# Patient Record
Sex: Male | Born: 1965 | Race: White | Hispanic: No | Marital: Married | Smoking: Former smoker
Health system: Southern US, Community
[De-identification: ages and names within clinical notes are randomized; demographics above are authoritative.]

## PROBLEM LIST (undated history)

## (undated) DIAGNOSIS — J111 Influenza due to unidentified influenza virus with other respiratory manifestations: Secondary | ICD-10-CM

## (undated) DIAGNOSIS — M2550 Pain in unspecified joint: Secondary | ICD-10-CM

## (undated) DIAGNOSIS — K219 Gastro-esophageal reflux disease without esophagitis: Secondary | ICD-10-CM

## (undated) DIAGNOSIS — M1990 Unspecified osteoarthritis, unspecified site: Secondary | ICD-10-CM

## (undated) DIAGNOSIS — M9110 Juvenile osteochondrosis of head of femur [Legg-Calve-Perthes], unspecified leg: Secondary | ICD-10-CM

## (undated) DIAGNOSIS — Z8709 Personal history of other diseases of the respiratory system: Secondary | ICD-10-CM

---

## 2013-07-02 DIAGNOSIS — J111 Influenza due to unidentified influenza virus with other respiratory manifestations: Secondary | ICD-10-CM

## 2013-07-02 HISTORY — DX: Influenza due to unidentified influenza virus with other respiratory manifestations: J11.1

## 2013-12-26 ENCOUNTER — Other Ambulatory Visit: Payer: Self-pay | Admitting: Orthopedic Surgery

## 2014-01-09 ENCOUNTER — Encounter (HOSPITAL_COMMUNITY): Payer: Self-pay | Admitting: Pharmacy Technician

## 2014-01-10 NOTE — Pre-Procedure Instructions (Signed)
Phillip BarrenShannon Floyd  01/10/2014   Your procedure is scheduled on:  Wed, May 20 @ 12:50 PM  [facility]  at 10:45 AM.  Call this number if you have problems the morning of surgery: [phone]   Remember:   Do not eat food or drink liquids after midnight.   Take these medicines the morning of surgery with A SIP OF WATER: Pain Pill(if needed) and Omeprazole(Prilosec)                  No Goody's,BC's,Aleve,Aspirin,Ibuprofen,Fish Oil,or any Herbal Medications   Do not wear jewelry, make-up or nail polish.  Do not wear lotions, powders, or perfumes. You may wear deodorant.  Do not shave 48 hours prior to surgery.   Do not bring valuables to the hospital.  [facility] is not responsible                  for any belongings or valuables.               Contacts, dentures or bridgework may not be worn into surgery.  Leave suitcase in the car. After surgery it may be brought to your room.  For patients admitted to the hospital, discharge time is determined by your                treatment team.                 Special Instructions:  [name] - Preparing for Surgery  Before surgery, you can play an important role.  Because skin is not sterile, your skin needs to be as free of germs as possible.  You can reduce the number of germs on you skin by washing with CHG (chlorahexidine gluconate) soap before surgery.  CHG is an antiseptic cleaner which kills germs and bonds with the skin to continue killing germs even after washing.  Please DO NOT use if you have an allergy to CHG or antibacterial soaps.  If your skin becomes reddened/irritated stop using the CHG and inform your nurse when you arrive at Short Stay.  Do not shave (including legs and underarms) for at least 48 hours prior to the first CHG shower.  You may shave your face.  Please follow these instructions carefully:   1.  Shower with CHG Soap the night before surgery and the                                morning of  Surgery.  2.  If you choose to wash your hair, wash your hair first as usual with your       normal shampoo.  3.  After you shampoo, rinse your hair and body thoroughly to remove the                      Shampoo.  4.  Use CHG as you would any other liquid soap.  You can apply chg directly       to the skin and wash gently with scrungie or a clean washcloth.  5.  Apply the CHG Soap to your body ONLY FROM THE NECK DOWN.        Do not use on open wounds or open sores.  Avoid contact with your eyes,       ears, mouth and genitals (private parts).  Wash genitals (private parts)  with your normal soap.  6.  Wash thoroughly, paying special attention to the area where your surgery        will be performed.  7.  Thoroughly rinse your body with warm water from the neck down.  8.  DO NOT shower/wash with your normal soap after using and rinsing off       the CHG Soap.  9.  Pat yourself dry with a clean towel.            10.  Wear clean pajamas.            11.  Place clean sheets on your bed the night of your first shower and do not        sleep with pets.  Day of Surgery  Do not apply any lotions/deoderants the morning of surgery.  Please wear clean clothes to the hospital/surgery center.     Please read over the following fact sheets that you were given: Pain Booklet, Coughing and Deep Breathing, Blood Transfusion Information, MRSA Information and Surgical Site Infection Prevention

## 2014-01-11 ENCOUNTER — Encounter (HOSPITAL_COMMUNITY)
Admission: RE | Admit: 2014-01-11 | Discharge: 2014-01-11 | Disposition: A | Discharge status: Home / Self Care | Payer: BC Managed Care – PPO | Source: Ambulatory Visit | Attending: Orthopedic Surgery | Admitting: Orthopedic Surgery

## 2014-01-11 ENCOUNTER — Encounter (HOSPITAL_COMMUNITY): Payer: Self-pay

## 2014-01-11 DIAGNOSIS — Z01812 Encounter for preprocedural laboratory examination: Secondary | ICD-10-CM | POA: Insufficient documentation

## 2014-01-11 DIAGNOSIS — Z01810 Encounter for preprocedural cardiovascular examination: Secondary | ICD-10-CM | POA: Insufficient documentation

## 2014-01-11 DIAGNOSIS — Z01818 Encounter for other preprocedural examination: Secondary | ICD-10-CM | POA: Insufficient documentation

## 2014-01-11 HISTORY — DX: Unspecified osteoarthritis, unspecified site: M19.90

## 2014-01-11 HISTORY — DX: Pain in unspecified joint: M25.50

## 2014-01-11 HISTORY — DX: Influenza due to unidentified influenza virus with other respiratory manifestations: J11.1

## 2014-01-11 HISTORY — DX: Gastro-esophageal reflux disease without esophagitis: K21.9

## 2014-01-11 HISTORY — DX: Personal history of other diseases of the respiratory system: Z87.09

## 2014-01-11 LAB — TYPE AND SCREEN
ABO/RH(D): A POS
Antibody Screen: NEGATIVE

## 2014-01-11 LAB — URINALYSIS, ROUTINE W REFLEX MICROSCOPIC
Bilirubin Urine: NEGATIVE
GLUCOSE, UA: NEGATIVE mg/dL
Hgb urine dipstick: NEGATIVE
Ketones, ur: NEGATIVE mg/dL
LEUKOCYTES UA: NEGATIVE
NITRITE: NEGATIVE
PH: 5 (ref 5.0–8.0)
PROTEIN: NEGATIVE mg/dL
Specific Gravity, Urine: 1.026 (ref 1.005–1.030)
Urobilinogen, UA: 0.2 mg/dL (ref 0.0–1.0)

## 2014-01-11 LAB — SURGICAL PCR SCREEN
MRSA, PCR: NEGATIVE
Staphylococcus aureus: NEGATIVE

## 2014-01-11 LAB — CBC WITH DIFFERENTIAL/PLATELET
Basophils Absolute: 0 10*3/uL (ref 0.0–0.1)
Basophils Relative: 1 % (ref 0–1)
EOS ABS: 0.1 10*3/uL (ref 0.0–0.7)
Eosinophils Relative: 2 % (ref 0–5)
HEMATOCRIT: 41.8 % (ref 39.0–52.0)
HEMOGLOBIN: 14.3 g/dL (ref 13.0–17.0)
Lymphocytes Relative: 25 % (ref 12–46)
Lymphs Abs: 1.7 10*3/uL (ref 0.7–4.0)
MCH: 30.4 pg (ref 26.0–34.0)
MCHC: 34.2 g/dL (ref 30.0–36.0)
MCV: 88.7 fL (ref 78.0–100.0)
MONO ABS: 0.7 10*3/uL (ref 0.1–1.0)
Monocytes Relative: 10 % (ref 3–12)
Neutro Abs: 4.2 10*3/uL (ref 1.7–7.7)
Neutrophils Relative %: 62 % (ref 43–77)
Platelets: 308 10*3/uL (ref 150–400)
RBC: 4.71 MIL/uL (ref 4.22–5.81)
RDW: 12.9 % (ref 11.5–15.5)
WBC: 6.7 10*3/uL (ref 4.0–10.5)

## 2014-01-11 LAB — BASIC METABOLIC PANEL
BUN: 14 mg/dL (ref 6–23)
CHLORIDE: 104 meq/L (ref 96–112)
CO2: 25 meq/L (ref 19–32)
Calcium: 9.9 mg/dL (ref 8.4–10.5)
Creatinine, Ser: 0.86 mg/dL (ref 0.50–1.35)
GFR calc Af Amer: >90 mL/min (ref >90)
GFR calc non Af Amer: >90 mL/min (ref >90)
Glucose, Bld: 83 mg/dL (ref 70–99)
POTASSIUM: 3.9 meq/L (ref 3.7–5.3)
Sodium: 140 mEq/L (ref 137–147)

## 2014-01-11 LAB — ABO/RH: ABO/RH(D): A POS

## 2014-01-11 LAB — PROTIME-INR
INR: 0.96 (ref 0.00–1.49)
Prothrombin Time: 12.6 seconds (ref 11.6–15.2)

## 2014-01-11 LAB — APTT: aPTT: 26 seconds (ref 24–37)

## 2014-01-11 MED ORDER — CHLORHEXIDINE GLUCONATE 4 % EX LIQD: 60.0000 mL | Freq: Once | CUTANEOUS | Status: DC

## 2014-01-11 NOTE — Progress Notes (Signed)
Pt doesn't have a cardiologist  Pt doesn't have a medical md  Denies ever having an echo/heart cath/stress test  Denies EKG or CXR in past y

## 2014-01-17 MED ORDER — DEXTROSE-NACL 5-0.45 % IV SOLN: INTRAVENOUS | Status: DC

## 2014-01-17 MED ORDER — CEFAZOLIN SODIUM-DEXTROSE 2-3 GM-% IV SOLR
2.0000 g | INTRAVENOUS | Status: AC
  Administered 2014-01-18: 2 g via INTRAVENOUS
  Filled 2014-01-17: qty 50

## 2014-01-17 NOTE — H&P (Signed)
TOTAL HIP ADMISSION H&P  Patient is admitted for left total hip arthroplasty.  Subjective:  Chief Complaint: left hip pain  HPI: Phillip Floyd, 48 y.o. male, has a history of pain and functional disability in the left hip(s) due to arthritis from prior history of legg calve perthes and patient has failed non-surgical conservative treatments for greater than 12 weeks to include NSAID's and/or analgesics, use of assistive devices and activity modification.  Onset of symptoms was gradual starting >10 years ago with gradually worsening course since that time.The patient noted no past surgery on the left hip(s).  Patient currently rates pain in the left hip at 10 out of 10 with activity. Patient has night pain, worsening of pain with activity and weight bearing, pain that interfers with activities of daily living, pain with passive range of motion and crepitus. Patient has evidence of joint space narrowing and flattening of the femoral head by imaging studies. This condition presents safety issues increasing the risk of falls. This patient has had leg calve Perthes.  There is no current active infection.  There are no active problems to display for this patient.  Past Medical History  Diagnosis Date  . GERD (gastroesophageal reflux disease)     takes Omeprazole daily  . History of bronchitis end of 2014  . Flu 07/2013  . Arthritis   . Joint pain     No past surgical history on file.  No prescriptions prior to admission   Allergies  Allergen Reactions  . Tramadol     headaches    History  Substance Use Topics  . Smoking status: Former Games developermoker  . Smokeless tobacco: Not on file     Comment: quit smoking 20+yrs ago  . Alcohol Use: No    No family history on file.   Review of Systems  Constitutional: Negative.   HENT: Negative.   Eyes:       Glasses  Respiratory: Negative.   Cardiovascular: Negative.   Gastrointestinal: Negative.   Genitourinary: Negative.   Musculoskeletal:  Positive for joint pain.  Skin: Negative.   Neurological: Negative.   Endo/Heme/Allergies: Negative.   Psychiatric/Behavioral: Negative.     Objective:  Physical Exam  Constitutional: He is oriented to person, place, and time. He appears well-developed and well-nourished.  HENT:  Head: Normocephalic and atraumatic.  Eyes: Pupils are equal, round, and reactive to light.  Neck: Normal range of motion. Neck supple.  Cardiovascular: Intact distal pulses.   Respiratory: Effort normal.  Musculoskeletal: He exhibits tenderness.  Patient's right hip has good strength and good range of motion.  Patient's left hip does have obvious reduced range of motion more so with internal rotation and external rotation.  Patient has approximately 5-10 of internal rotation.  He has pain with palpation of the groin and mild lateral tenderness over the greater trochanteric bursal region.  Increased pain with hip flexion extension internal/external rotation and log roll.  He has brisk capillary refill and is neurovascularly intact distally.  His calves are soft and nontender.  Neurological: He is alert and oriented to person, place, and time.  Skin: Skin is warm and dry.  Psychiatric: He has a normal mood and affect. His behavior is normal. Judgment and thought content normal.    Vital signs in last 24 hours:    Labs:   There is no height or weight on file to calculate BMI.   Imaging Review X-rays: AP of the pelvis and one view of the left hip are reviewed  in office today.  Dr. Turner Danielsowan has also reviewed these x-rays.  Patient does appear to have leg Perthes disease of the left hip with flattening of the femoral head and sclerosis of the acetabulum.  Patient's left hip does appear to be chronically subluxed.  Assessment/Plan:  End stage arthritis, left hip(s)  The patient history, physical examination, clinical judgement of the provider and imaging studies are consistent with end stage degenerative joint  disease of the left hip(s) and total hip arthroplasty is deemed medically necessary. The treatment options including medical management, injection therapy, arthroscopy and arthroplasty were discussed at length. The risks and benefits of total hip arthroplasty were presented and reviewed. The risks due to aseptic loosening, infection, stiffness, dislocation/subluxation,  thromboembolic complications and other imponderables were discussed.  The patient acknowledged the explanation, agreed to proceed with the plan and consent was signed. Patient is being admitted for inpatient treatment for surgery, pain control, PT, OT, prophylactic antibiotics, VTE prophylaxis, progressive ambulation and ADL's and discharge planning.The patient is planning to be discharged home with home health services

## 2014-01-18 ENCOUNTER — Inpatient Hospital Stay (HOSPITAL_COMMUNITY)
Admission: RE | Admit: 2014-01-18 | Discharge: 2014-01-20 | DRG: 470 | Disposition: A | Discharge status: Home Health | Payer: BC Managed Care – PPO | Source: Ambulatory Visit | Attending: Orthopedic Surgery | Admitting: Orthopedic Surgery

## 2014-01-18 ENCOUNTER — Encounter (HOSPITAL_COMMUNITY)
Admission: RE | Disposition: A | Discharge status: Home Health | Payer: Self-pay | Source: Ambulatory Visit | Attending: Orthopedic Surgery

## 2014-01-18 ENCOUNTER — Inpatient Hospital Stay (HOSPITAL_COMMUNITY): Payer: BC Managed Care – PPO | Admitting: Certified Registered"

## 2014-01-18 ENCOUNTER — Inpatient Hospital Stay (HOSPITAL_COMMUNITY): Payer: BC Managed Care – PPO

## 2014-01-18 ENCOUNTER — Encounter (HOSPITAL_COMMUNITY): Payer: Self-pay | Admitting: *Deleted

## 2014-01-18 ENCOUNTER — Encounter (HOSPITAL_COMMUNITY): Payer: BC Managed Care – PPO | Admitting: Certified Registered"

## 2014-01-18 DIAGNOSIS — Z87891 Personal history of nicotine dependence: Secondary | ICD-10-CM

## 2014-01-18 DIAGNOSIS — K219 Gastro-esophageal reflux disease without esophagitis: Secondary | ICD-10-CM | POA: Diagnosis present

## 2014-01-18 DIAGNOSIS — M9190 Juvenile osteochondrosis of hip and pelvis, unspecified, unspecified leg: Secondary | ICD-10-CM | POA: Diagnosis present

## 2014-01-18 DIAGNOSIS — M1612 Unilateral primary osteoarthritis, left hip: Secondary | ICD-10-CM

## 2014-01-18 DIAGNOSIS — M1610 Unilateral primary osteoarthritis, unspecified hip: Principal | ICD-10-CM | POA: Diagnosis present

## 2014-01-18 DIAGNOSIS — M169 Osteoarthritis of hip, unspecified: Principal | ICD-10-CM | POA: Diagnosis present

## 2014-01-18 HISTORY — DX: Juvenile osteochondrosis of head of femur (Legg-Calve-Perthes), unspecified leg: M91.10

## 2014-01-18 HISTORY — PX: TOTAL HIP ARTHROPLASTY: SHX124

## 2014-01-18 SURGERY — ARTHROPLASTY, HIP, TOTAL,POSTERIOR APPROACH
Anesthesia: General | Laterality: Left

## 2014-01-18 MED ORDER — METHOCARBAMOL 1000 MG/10ML IJ SOLN
500.0000 mg | Freq: Four times a day (QID) | INTRAVENOUS | Status: DC | PRN
  Administered 2014-01-18: 500 mg via INTRAVENOUS
  Filled 2014-01-18 (×2): qty 5

## 2014-01-18 MED ORDER — LIDOCAINE HCL (CARDIAC) 20 MG/ML IV SOLN
INTRAVENOUS | Status: AC
  Filled 2014-01-18: qty 5

## 2014-01-18 MED ORDER — MIDAZOLAM HCL 5 MG/5ML IJ SOLN
INTRAMUSCULAR | Status: DC | PRN
  Administered 2014-01-18: 2 mg via INTRAVENOUS

## 2014-01-18 MED ORDER — SODIUM CHLORIDE 0.9 % IR SOLN
Status: DC | PRN
  Administered 2014-01-18: 1000 mL

## 2014-01-18 MED ORDER — PROPOFOL 10 MG/ML IV BOLUS
INTRAVENOUS | Status: AC
  Filled 2014-01-18: qty 20

## 2014-01-18 MED ORDER — ROCURONIUM BROMIDE 100 MG/10ML IV SOLN
INTRAVENOUS | Status: DC | PRN
  Administered 2014-01-18: 50 mg via INTRAVENOUS
  Administered 2014-01-18: 10 mg via INTRAVENOUS

## 2014-01-18 MED ORDER — DOCUSATE SODIUM 100 MG PO CAPS
100.0000 mg | ORAL_CAPSULE | Freq: Two times a day (BID) | ORAL | Status: DC
  Administered 2014-01-18 – 2014-01-20 (×4): 100 mg via ORAL
  Filled 2014-01-18 (×4): qty 1

## 2014-01-18 MED ORDER — HYDROMORPHONE HCL PF 1 MG/ML IJ SOLN
0.2500 mg | INTRAMUSCULAR | Status: DC | PRN
  Administered 2014-01-18 (×4): 0.5 mg via INTRAVENOUS

## 2014-01-18 MED ORDER — ONDANSETRON HCL 4 MG/2ML IJ SOLN
INTRAMUSCULAR | Status: AC
  Filled 2014-01-18: qty 2

## 2014-01-18 MED ORDER — ACETAMINOPHEN 325 MG PO TABS: 650.0000 mg | ORAL_TABLET | Freq: Four times a day (QID) | ORAL | Status: DC | PRN

## 2014-01-18 MED ORDER — HYDROMORPHONE HCL PF 1 MG/ML IJ SOLN
INTRAMUSCULAR | Status: AC
  Filled 2014-01-18: qty 1

## 2014-01-18 MED ORDER — ALUMINUM HYDROXIDE GEL 320 MG/5ML PO SUSP
15.0000 mL | ORAL | Status: DC | PRN
  Filled 2014-01-18: qty 30

## 2014-01-18 MED ORDER — HYDROMORPHONE HCL PF 1 MG/ML IJ SOLN
INTRAMUSCULAR | Status: DC | PRN
  Administered 2014-01-18 (×2): 0.5 mg via INTRAVENOUS

## 2014-01-18 MED ORDER — ONDANSETRON HCL 4 MG/2ML IJ SOLN: 4.0000 mg | Freq: Once | INTRAMUSCULAR | Status: DC | PRN

## 2014-01-18 MED ORDER — ONDANSETRON HCL 4 MG PO TABS
4.0000 mg | ORAL_TABLET | Freq: Four times a day (QID) | ORAL | Status: DC | PRN
  Administered 2014-01-19: 4 mg via ORAL
  Filled 2014-01-18: qty 1

## 2014-01-18 MED ORDER — EPHEDRINE SULFATE 50 MG/ML IJ SOLN
INTRAMUSCULAR | Status: DC | PRN
  Administered 2014-01-18: 10 mg via INTRAVENOUS

## 2014-01-18 MED ORDER — PANTOPRAZOLE SODIUM 40 MG PO TBEC
40.0000 mg | DELAYED_RELEASE_TABLET | Freq: Every day | ORAL | Status: DC
  Administered 2014-01-19 – 2014-01-20 (×2): 40 mg via ORAL
  Filled 2014-01-18 (×3): qty 1

## 2014-01-18 MED ORDER — OXYCODONE HCL 5 MG PO TABS
5.0000 mg | ORAL_TABLET | ORAL | Status: DC | PRN
  Administered 2014-01-18: 10 mg via ORAL
  Administered 2014-01-18: 5 mg via ORAL
  Administered 2014-01-19 – 2014-01-20 (×6): 10 mg via ORAL
  Filled 2014-01-18 (×6): qty 2
  Filled 2014-01-18: qty 1
  Filled 2014-01-18: qty 2

## 2014-01-18 MED ORDER — KCL IN DEXTROSE-NACL 20-5-0.45 MEQ/L-%-% IV SOLN
INTRAVENOUS | Status: DC
  Administered 2014-01-18 – 2014-01-20 (×4): via INTRAVENOUS
  Filled 2014-01-18 (×8): qty 1000

## 2014-01-18 MED ORDER — GLYCOPYRROLATE 0.2 MG/ML IJ SOLN
INTRAMUSCULAR | Status: DC | PRN
  Administered 2014-01-18: 0.4 mg via INTRAVENOUS

## 2014-01-18 MED ORDER — ONDANSETRON HCL 4 MG/2ML IJ SOLN: 4.0000 mg | Freq: Four times a day (QID) | INTRAMUSCULAR | Status: DC | PRN

## 2014-01-18 MED ORDER — ROCURONIUM BROMIDE 50 MG/5ML IV SOLN
INTRAVENOUS | Status: AC
  Filled 2014-01-18: qty 1

## 2014-01-18 MED ORDER — GLYCOPYRROLATE 0.2 MG/ML IJ SOLN
INTRAMUSCULAR | Status: AC
  Filled 2014-01-18: qty 2

## 2014-01-18 MED ORDER — TRANEXAMIC ACID 100 MG/ML IV SOLN
1000.0000 mg | INTRAVENOUS | Status: AC
  Administered 2014-01-18: 1000 mg via INTRAVENOUS
  Filled 2014-01-18: qty 10

## 2014-01-18 MED ORDER — BUPIVACAINE-EPINEPHRINE (PF) 0.5% -1:200000 IJ SOLN
INTRAMUSCULAR | Status: AC
  Filled 2014-01-18: qty 30

## 2014-01-18 MED ORDER — PHENOL 1.4 % MT LIQD: 1.0000 | OROMUCOSAL | Status: DC | PRN

## 2014-01-18 MED ORDER — ACETAMINOPHEN 650 MG RE SUPP: 650.0000 mg | Freq: Four times a day (QID) | RECTAL | Status: DC | PRN

## 2014-01-18 MED ORDER — EPHEDRINE SULFATE 50 MG/ML IJ SOLN
INTRAMUSCULAR | Status: AC
  Filled 2014-01-18: qty 1

## 2014-01-18 MED ORDER — DIPHENHYDRAMINE HCL 12.5 MG/5ML PO ELIX: 12.5000 mg | ORAL_SOLUTION | ORAL | Status: DC | PRN

## 2014-01-18 MED ORDER — BUPIVACAINE-EPINEPHRINE 0.5% -1:200000 IJ SOLN
INTRAMUSCULAR | Status: DC | PRN
  Administered 2014-01-18: 20 mL

## 2014-01-18 MED ORDER — MENTHOL 3 MG MT LOZG: 1.0000 | LOZENGE | OROMUCOSAL | Status: DC | PRN

## 2014-01-18 MED ORDER — LIDOCAINE HCL (CARDIAC) 20 MG/ML IV SOLN
INTRAVENOUS | Status: DC | PRN
Start: 2014-01-18 — End: 2014-01-18
  Administered 2014-01-18: 70 mg via INTRAVENOUS

## 2014-01-18 MED ORDER — ONDANSETRON HCL 4 MG/2ML IJ SOLN
INTRAMUSCULAR | Status: DC | PRN
  Administered 2014-01-18: 4 mg via INTRAVENOUS

## 2014-01-18 MED ORDER — LIDOCAINE HCL 4 % MT SOLN
OROMUCOSAL | Status: DC | PRN
  Administered 2014-01-18: 2.5 mL via TOPICAL

## 2014-01-18 MED ORDER — PROPOFOL 10 MG/ML IV BOLUS
INTRAVENOUS | Status: DC | PRN
  Administered 2014-01-18: 200 mg via INTRAVENOUS

## 2014-01-18 MED ORDER — MIDAZOLAM HCL 2 MG/2ML IJ SOLN
INTRAMUSCULAR | Status: AC
  Filled 2014-01-18: qty 2

## 2014-01-18 MED ORDER — METHOCARBAMOL 500 MG PO TABS
500.0000 mg | ORAL_TABLET | Freq: Four times a day (QID) | ORAL | Status: DC | PRN
  Administered 2014-01-19 (×3): 500 mg via ORAL
  Filled 2014-01-18 (×3): qty 1

## 2014-01-18 MED ORDER — METOCLOPRAMIDE HCL 5 MG PO TABS: 5.0000 mg | ORAL_TABLET | Freq: Three times a day (TID) | ORAL | Status: DC | PRN

## 2014-01-18 MED ORDER — HYDROMORPHONE HCL PF 1 MG/ML IJ SOLN
0.5000 mg | INTRAMUSCULAR | Status: DC | PRN
  Administered 2014-01-18 – 2014-01-20 (×10): 1 mg via INTRAVENOUS
  Filled 2014-01-18 (×10): qty 1

## 2014-01-18 MED ORDER — ASPIRIN EC 325 MG PO TBEC
325.0000 mg | DELAYED_RELEASE_TABLET | Freq: Every day | ORAL | Status: DC
Start: 2014-01-19 — End: 2014-01-20
  Administered 2014-01-19 – 2014-01-20 (×2): 325 mg via ORAL
  Filled 2014-01-18 (×4): qty 1

## 2014-01-18 MED ORDER — LACTATED RINGERS IV SOLN
INTRAVENOUS | Status: DC
  Administered 2014-01-18 (×2): via INTRAVENOUS

## 2014-01-18 MED ORDER — SUFENTANIL CITRATE 50 MCG/ML IV SOLN
INTRAVENOUS | Status: DC | PRN
  Administered 2014-01-18: 5 ug via INTRAVENOUS
  Administered 2014-01-18: 20 ug via INTRAVENOUS
  Administered 2014-01-18 (×2): 5 ug via INTRAVENOUS
  Administered 2014-01-18: 10 ug via INTRAVENOUS
  Administered 2014-01-18: 5 ug via INTRAVENOUS

## 2014-01-18 MED ORDER — NEOSTIGMINE METHYLSULFATE 10 MG/10ML IV SOLN
INTRAVENOUS | Status: AC
  Filled 2014-01-18: qty 1

## 2014-01-18 MED ORDER — SODIUM CHLORIDE 0.9 % IJ SOLN
INTRAMUSCULAR | Status: AC
  Filled 2014-01-18: qty 10

## 2014-01-18 MED ORDER — METOCLOPRAMIDE HCL 5 MG/ML IJ SOLN: 5.0000 mg | Freq: Three times a day (TID) | INTRAMUSCULAR | Status: DC | PRN

## 2014-01-18 MED ORDER — SUFENTANIL CITRATE 50 MCG/ML IV SOLN
INTRAVENOUS | Status: AC
  Filled 2014-01-18: qty 1

## 2014-01-18 MED ORDER — NEOSTIGMINE METHYLSULFATE 10 MG/10ML IV SOLN
INTRAVENOUS | Status: DC | PRN
  Administered 2014-01-18: 3 mg via INTRAVENOUS

## 2014-01-18 SURGICAL SUPPLY — 52 items
BLADE SAW SGTL 18X1.27X75 (BLADE) ×2 IMPLANT
BRUSH FEMORAL CANAL (MISCELLANEOUS) IMPLANT
CAPT HIP PF COP ×2 IMPLANT
COVER BACK TABLE 24X17X13 BIG (DRAPES) IMPLANT
COVER SURGICAL LIGHT HANDLE (MISCELLANEOUS) ×2 IMPLANT
DRAPE ORTHO SPLIT 77X108 STRL (DRAPES) ×1
DRAPE PROXIMA HALF (DRAPES) ×2 IMPLANT
DRAPE SURG ORHT 6 SPLT 77X108 (DRAPES) ×1 IMPLANT
DRAPE U-SHAPE 47X51 STRL (DRAPES) ×2 IMPLANT
DRILL BIT 7/64X5 (BIT) ×2 IMPLANT
DRSG AQUACEL AG ADV 3.5X10 (GAUZE/BANDAGES/DRESSINGS) ×2 IMPLANT
DURAPREP 26ML APPLICATOR (WOUND CARE) ×2 IMPLANT
ELECT BLADE 4.0 EZ CLEAN MEGAD (MISCELLANEOUS)
ELECT REM PT RETURN 9FT ADLT (ELECTROSURGICAL) ×2
ELECTRODE BLDE 4.0 EZ CLN MEGD (MISCELLANEOUS) IMPLANT
ELECTRODE REM PT RTRN 9FT ADLT (ELECTROSURGICAL) ×1 IMPLANT
GAUZE XEROFORM 1X8 LF (GAUZE/BANDAGES/DRESSINGS) IMPLANT
GLOVE BIO SURGEON STRL SZ7.5 (GLOVE) ×2 IMPLANT
GLOVE BIO SURGEON STRL SZ8.5 (GLOVE) ×4 IMPLANT
GLOVE BIOGEL PI IND STRL 8 (GLOVE) ×2 IMPLANT
GLOVE BIOGEL PI IND STRL 9 (GLOVE) ×1 IMPLANT
GLOVE BIOGEL PI INDICATOR 8 (GLOVE) ×2
GLOVE BIOGEL PI INDICATOR 9 (GLOVE) ×1
GOWN STRL REUS W/ TWL LRG LVL3 (GOWN DISPOSABLE) ×1 IMPLANT
GOWN STRL REUS W/ TWL XL LVL3 (GOWN DISPOSABLE) ×2 IMPLANT
GOWN STRL REUS W/TWL LRG LVL3 (GOWN DISPOSABLE) ×1
GOWN STRL REUS W/TWL XL LVL3 (GOWN DISPOSABLE) ×2
HANDPIECE INTERPULSE COAX TIP (DISPOSABLE)
HOOD PEEL AWAY FACE SHEILD DIS (HOOD) ×4 IMPLANT
KIT BASIN OR (CUSTOM PROCEDURE TRAY) ×2 IMPLANT
KIT ROOM TURNOVER OR (KITS) ×2 IMPLANT
MANIFOLD NEPTUNE II (INSTRUMENTS) ×2 IMPLANT
NEEDLE 22X1 1/2 (OR ONLY) (NEEDLE) ×2 IMPLANT
NS IRRIG 1000ML POUR BTL (IV SOLUTION) ×2 IMPLANT
PACK TOTAL JOINT (CUSTOM PROCEDURE TRAY) ×2 IMPLANT
PAD ARMBOARD 7.5X6 YLW CONV (MISCELLANEOUS) ×4 IMPLANT
PASSER SUT SWANSON 36MM LOOP (INSTRUMENTS) ×2 IMPLANT
PRESSURIZER FEMORAL UNIV (MISCELLANEOUS) IMPLANT
SET HNDPC FAN SPRY TIP SCT (DISPOSABLE) IMPLANT
SUT ETHIBOND 2 V 37 (SUTURE) ×2 IMPLANT
SUT VIC AB 0 CTB1 27 (SUTURE) ×2 IMPLANT
SUT VIC AB 1 CTX 36 (SUTURE) ×1
SUT VIC AB 1 CTX36XBRD ANBCTR (SUTURE) ×1 IMPLANT
SUT VIC AB 2-0 CTB1 (SUTURE) ×2 IMPLANT
SUT VIC AB 3-0 SH 27 (SUTURE) ×1
SUT VIC AB 3-0 SH 27X BRD (SUTURE) ×1 IMPLANT
SYR CONTROL 10ML LL (SYRINGE) ×2 IMPLANT
TOWEL OR 17X24 6PK STRL BLUE (TOWEL DISPOSABLE) ×2 IMPLANT
TOWEL OR 17X26 10 PK STRL BLUE (TOWEL DISPOSABLE) ×2 IMPLANT
TOWER CARTRIDGE SMART MIX (DISPOSABLE) IMPLANT
TRAY FOLEY CATH 14FR (SET/KITS/TRAYS/PACK) IMPLANT
WATER STERILE IRR 1000ML POUR (IV SOLUTION) ×8 IMPLANT

## 2014-01-18 NOTE — Interval H&P Note (Signed)
History and Physical Interval Note:  01/18/2014 12:38 PM  Phillip Floyd  has presented today for surgery, with the diagnosis of LEFT HIP LEGG PERTHES  The various methods of treatment have been discussed with the patient and family. After consideration of risks, benefits and other options for treatment, the patient has consented to  Procedure(s): TOTAL HIP ARTHROPLASTY (Left) as a surgical intervention .  The patient's history has been reviewed, patient examined, no change in status, stable for surgery.  I have reviewed the patient's chart and labs.  Questions were answered to the patient's satisfaction.     Nestor LewandowskyFrank J Tenecia Ignasiak

## 2014-01-18 NOTE — Op Note (Signed)
OPERATIVE REPORT    DATE OF PROCEDURE:  01/18/2014       PREOPERATIVE DIAGNOSIS:  LEFT HIP LEGG PERTHES                                                          POSTOPERATIVE DIAGNOSIS:  LEFT HIP LEGG PERTHES                                                           PROCEDURE:  L total hip arthroplasty using a 52 mm DePuy Pinnacle  Cup, Peabody Energypex Hole Eliminator, 10-degree polyethylene liner index superior  and posterior, a +0 36 mm ceramic head, a 18x13x42x160 SROM stem, 18Bsm Sleeve   SURGEON: Nestor LewandowskyFrank J Ercilia Bettinger    ASSISTANT:   Tomi LikensEric K. Gaylene BrooksPhillips PA-C  (present throughout entire procedure and necessary for timely completion of the procedure)   ANESTHESIA: General BLOOD LOSS: 300 FLUID REPLACEMENT: 1500 crystalloid DRAINS: Foley Catheter URINE OUTPUT: 300cc COMPLICATIONS: none    INDICATIONS FOR PROCEDURE: A 48 y.o. year-old With  LEFT HIP LEGG PERTHES   for 2 years, x-rays show severe Legg-Perthes disease with flattening of the femoral head and a very shallow acetabular socket. Despite conservative measures with observation, anti-inflammatory medicine, narcotics,  has severe unremitting pain and can ambulate only a few blocks before resting.  Patient desires elective L total hip arthroplasty to decrease pain and increase function. The risks, benefits, and alternatives were discussed at length including but not limited to the risks of infection, bleeding, nerve injury, stiffness, blood clots, the need for revision surgery, cardiopulmonary complications, among others, and they were willing to proceed. Questions answered     PROCEDURE IN DETAIL: The patient was identified by armband,  received preoperative [facility], taken to the operating room , appropriate anesthetic monitors  were attached and general endotracheal anesthesia induced. Foley catheter was inserted. Pt was rolled into the R lateral decubitus position and fixed there with a Stulberg  Mark II pelvic clamp.  The L lower extremity was then prepped and draped  in the usual sterile fashion from the ankle to the hemipelvis. A time-out  procedure was performed. The skin along the lateral hip and thigh  infiltrated with 10 mL of 0.5% Marcaine and epinephrine solution. We  then made a posterolateral approach to the hip. With a #10 blade, a 15 cm  incision was made through the skin and subcutaneous tissue down to the level of the  IT band. Small bleeders were identified and cauterized. The IT band was cut in  line with skin incision exposing the greater trochanter. A Cobra retractor was placed between the gluteus minimus and the superior hip joint capsule, and a spiked Cobra between the quadratus femoris and the inferior hip joint capsule. This isolated the short  external rotators and piriformis tendons. These were tagged with a #2 Ethibond  suture and cut off their insertion on the intertrochanteric crest. The posterior  capsule was then developed into an acetabular-based flap from Posterior Superior off of the acetabulum out over the femoral neck and back posterior  inferior to the acetabular rim. This flap was tagged with two #2 Ethibond sutures and retracted protecting the sciatic nerve. This exposed the arthritic femoral head and osteophytes. The hip was then flexed and internally rotated, dislocating the femoral head and a standard neck cut performed 1 fingerbreadth above the lesser trochanter.  A spiked Cobra was placed in the cotyloid notch and a Hohmann retractor was then used to lever the femur anteriorly off of the anterior pelvic column. A posterior-inferior wing retractor was placed at the junction of the acetabulum and the ischium completing the acetabular exposure.the acetabulum was grossly deformed flattened with a very large hypertrophic labrum appear We then removed the labrum from the acetabulum. We then reamed the acetabulum up to 51 mm with basket reamers obtaining good  coverage in all quadrants. We then irrigated with normal  saline solution and hammered into place a 52 mm pinnacle cup in 45  degrees of abduction and about 20 degrees of anteversion. More  peripheral osteophytes removed and a trial 10-degree liner placed with the  index superior-posterior. The hip was then flexed and internally rotated exposing the  proximal femur, which was entered with the initiating reamer followed by  the axial reamers up to a 13.5 mm full depth and 14mm partial depth. We then conically reamed to 18Bsm to the correct depth for a 42 base neck, trying to get back 1 cm of this 2 cm leg length discrepancy. The calcar was milled to 18Bsm. A trial cone and stem was inserted in the 25 degrees anteversion, with a +0 36mm trial head. Trial reduction was then performed and excellent stability was noted with at 90 of flexion with 75 of internal rotation and then full extension with maximal external rotation. The hip could not be dislocated in full extension. The knee could easily flex  to about 130 degrees. We also stretched the abductors at this point,  because of the preexisting adductor contractures. All trial components  were then removed. The acetabulum was irrigated out with normal saline  solution. A titanium [facility] Eliminator was then screwed into place  followed by a 10-degree polyethylene liner index superior-posterior. On  the femoral side a 18Bsm ZTT1 sleeve was hammered into place, followed by a 18x13x42x160 SROM stem in 25 degrees of anteversion. At this point, a +0 36 mm ceramic head was  hammered on the stem. The hip was reduced. We checked our stability  one more time and found it to be excellent. The wound was once again  thoroughly irrigated out with normal saline solution pulse lavage. The  capsular flap and short external rotators were repaired back to the  intertrochanteric crest through drill holes with a #2 Ethibond suture.  The IT band was closed with running  1 Vicryl suture. The subcutaneous  tissue with 0 and 2-0 undyed Vicryl suture and the skin with running  interlocking 3-0 nylon suture. Dressing of Xeroform and Mepilex was  then applied. The patient was then unclamped, rolled supine, awaken extubated and taken to recovery room without difficulty in stable condition.   Nestor LewandowskyFrank J Ahlia Lemanski 01/18/2014, 2:16 PM

## 2014-01-18 NOTE — Transfer of Care (Signed)
Immediate Anesthesia Transfer of Care Note  Patient: Phillip BarrenShannon Roggenkamp  Procedure(s) Performed: Procedure(s): TOTAL HIP ARTHROPLASTY (Left)  Patient Location: PACU  Anesthesia Type:General  Level of Consciousness: awake, alert , oriented and patient cooperative  Airway & Oxygen Therapy: Patient Spontanous Breathing and Patient connected to nasal cannula oxygen  Post-op Assessment: Report given to PACU RN and Post -op Vital signs reviewed and stable  Post vital signs: Reviewed and stable  Complications: No apparent anesthesia complications

## 2014-01-18 NOTE — Anesthesia Preprocedure Evaluation (Addendum)
Anesthesia Evaluation  Patient identified by MRN, date of birth, ID band Patient awake    Reviewed: Allergy & Precautions, H&P , NPO status , Patient's Chart, lab work & pertinent test results  Airway Mallampati: I TM Distance: >3 FB Neck ROM: Full    Dental  (+) Teeth Intact, Dental Advisory Given   Pulmonary former smoker,          Cardiovascular     Neuro/Psych    GI/Hepatic GERD-  ,  Endo/Other    Renal/GU      Musculoskeletal   Abdominal   Peds  Hematology   Anesthesia Other Findings   Reproductive/Obstetrics                          Anesthesia Physical Anesthesia Plan  ASA: I  Anesthesia Plan: General   Post-op Pain Management:    Induction: Intravenous  Airway Management Planned: Oral ETT  Additional Equipment: None  Intra-op Plan:   Post-operative Plan: Extubation in OR  Informed Consent: I have reviewed the patients History and Physical, chart, labs and discussed the procedure including the risks, benefits and alternatives for the proposed anesthesia with the patient or authorized representative who has indicated his/her understanding and acceptance.   Dental advisory given  Plan Discussed with: CRNA, Anesthesiologist and Surgeon  Anesthesia Plan Comments:         Anesthesia Quick Evaluation

## 2014-01-19 ENCOUNTER — Encounter (HOSPITAL_COMMUNITY): Payer: Self-pay | Admitting: Orthopedic Surgery

## 2014-01-19 LAB — BASIC METABOLIC PANEL
BUN: 9 mg/dL (ref 6–23)
CHLORIDE: 101 meq/L (ref 96–112)
CO2: 22 mEq/L (ref 19–32)
Calcium: 9 mg/dL (ref 8.4–10.5)
Creatinine, Ser: 0.78 mg/dL (ref 0.50–1.35)
GFR calc non Af Amer: >90 mL/min (ref >90)
Glucose, Bld: 150 mg/dL — ABNORMAL HIGH (ref 70–99)
POTASSIUM: 4.2 meq/L (ref 3.7–5.3)
Sodium: 137 mEq/L (ref 137–147)

## 2014-01-19 LAB — CBC
HEMATOCRIT: 33.8 % — AB (ref 39.0–52.0)
Hemoglobin: 11.3 g/dL — ABNORMAL LOW (ref 13.0–17.0)
MCH: 30.1 pg (ref 26.0–34.0)
MCHC: 33.4 g/dL (ref 30.0–36.0)
MCV: 89.9 fL (ref 78.0–100.0)
Platelets: 224 10*3/uL (ref 150–400)
RBC: 3.76 MIL/uL — ABNORMAL LOW (ref 4.22–5.81)
RDW: 13.1 % (ref 11.5–15.5)
WBC: 10.5 10*3/uL (ref 4.0–10.5)

## 2014-01-19 MED ORDER — HYDROMORPHONE HCL 4 MG PO TABS: 2.0000 mg | ORAL_TABLET | ORAL | Status: DC | PRN

## 2014-01-19 MED ORDER — METHOCARBAMOL 500 MG PO TABS: 500.0000 mg | ORAL_TABLET | Freq: Four times a day (QID) | ORAL | Status: DC

## 2014-01-19 MED ORDER — ASPIRIN EC 325 MG PO TBEC: 325.0000 mg | DELAYED_RELEASE_TABLET | Freq: Two times a day (BID) | ORAL | Status: AC

## 2014-01-19 NOTE — Progress Notes (Signed)
Physical Therapy Treatment Patient Details Name: Phillip BarrenShannon Floyd MRN: [id number] DOB: 10/10/1965 Today's Date: 01/19/2014    History of Present Illness Admitted with arthritic L hip due to L. Perthes dx.  S/P L THA    PT Comments    Progressing as expected.  Stairs and all exercises tomorrow.  Follow Up Recommendations  Home health PT     Equipment Recommendations  Rolling walker with 5" wheels;3in1 (PT)    Recommendations for Other Services       Precautions / Restrictions Restrictions LLE Weight Bearing: Weight bearing as tolerated    Mobility  Bed Mobility                  Transfers Overall transfer level: Needs assistance   Transfers: Sit to/from Stand Sit to Stand: Supervision         General transfer comment: cues for technique, no assist needed.  Ambulation/Gait Ambulation/Gait assistance: Supervision Ambulation Distance (Feet): 350 Feet Assistive device: Rolling walker (2 wheeled) Gait Pattern/deviations: Step-to pattern;Step-through pattern;Decreased stride length   Gait velocity interpretation: Below normal speed for age/gender General Gait Details: mildly antalgic and slow, but safe   Stairs            Wheelchair Mobility    Modified Rankin (Stroke Patients Only)       Balance Overall balance assessment: No apparent balance deficits (not formally assessed)                                  Cognition Arousal/Alertness: Awake/alert Behavior During Therapy: WFL for tasks assessed/performed Overall Cognitive Status: Within Functional Limits for tasks assessed                      Exercises      General Comments        Pertinent Vitals/Pain 8/10 pain    Home Living                      Prior Function            PT Goals (current goals can now be found in the care plan section) Acute Rehab PT Goals PT Goal Formulation: With patient Time For Goal Achievement: 01/19/14 Potential to  Achieve Goals: Good Progress towards PT goals: Progressing toward goals    Frequency  7X/week    PT Plan Current plan remains appropriate    Co-evaluation             End of Session   Activity Tolerance: Patient tolerated treatment well Patient left: in chair;with call bell/phone within reach;with family/visitor present     Time: 1715-1739 PT Time Calculation (min): 24 min  Charges:  $Gait Training: 8-22 mins $Therapeutic Activity: 8-22 mins                    G CodesEliseo Gum:      Darrek Leasure V [employer] 01/19/2014, 5:55 PM 01/19/2014   BingKen Kasy Iann[place]one, PT [phone]  (pager)

## 2014-01-19 NOTE — Clinical Social Work Note (Signed)
CSW consulted for possible SNF placement at time of discharge. Per chart review, pt to be discharged home with home health services. CSW signing off. Thank you for the referral.  Darlyn ChamberEmily [place], [facility] Bridge[facility]WA Clinical Social Worker [phone]

## 2014-01-19 NOTE — Progress Notes (Signed)
Patient ID: Phillip Floyd, male   DOB: 10/20/1965, [age] y.Phillip Floyd.   MRN: [id number] PATIENT ID: Phillip BarrenShannon Floyd  MRN: [id number]  DOB/AGE:  03/30/1966 / 48 y.o.  1 Day Post-Op Procedure(s) (LRB): TOTAL HIP ARTHROPLASTY (Left), Perthes Disease    PROGRESS NOTE Subjective: Patient is alert, oriented,no Nausea, no Vomiting, yes passing gas, no Bowel Movement. Taking PO well. Denies SOB, Chest or Calf Pain. Using Incentive Spirometer, PAS in place. Ambulate WBAT today Patient reports pain as 4 on 0-10 scale  .    Objective: Vital signs in last 24 hours: Filed Vitals:   01/18/14 2357 01/19/14 0233 01/19/14 0400 01/19/14 0605  BP:  145/78  138/76  Pulse:  83  97  Temp:  99 F (37.2 C)  99.1 F (37.3 C)  TempSrc:  Oral  Oral  Resp: 16 16 15 16   Height:      Weight:      SpO2: 97% 100% 98% 99%      Intake/Output from previous day: I/O last 3 completed shifts: In: 2100 [I.V.:2100] Out: 75 [Blood:75]   Intake/Output this shift: Total I/O In: 2175.8 [P.O.:700; I.V.:1420.8; IV Piggyback:55] Out: 1000 [Urine:1000]   LABORATORY DATA:  Recent Labs  01/19/14 0540  WBC 10.5  HGB 11.3*  HCT 33.8*  PLT 224    Examination: Neurologically intact ABD soft Neurovascular intact Sensation intact distally Intact pulses distally Dorsiflexion/Plantar flexion intact Incision: scant drainage No cellulitis present Compartment soft} XR AP&Lat of hip shows well placed\fixed THA  Assessment:   1 Day Post-Op Procedure(s) (LRB): TOTAL HIP ARTHROPLASTY (Left), Perthes disease ADDITIONAL DIAGNOSIS:    Plan: PT/OT WBAT, THA  posterior precautions  DVT Prophylaxis: SCDx72 hrs, ASA 325 mg BID x 2 weeks  DISCHARGE PLAN: Home, when paseses PT  DISCHARGE NEEDS: HHPT, HHRN, CPM, Walker and 3-in-1 comode seat

## 2014-01-19 NOTE — Care Management Note (Signed)
  Page 1 of 1   01/19/2014     11:46:24 AM CARE MANAGEMENT NOTE 01/19/2014  Patient:  Phillip Floyd,Phillip Floyd   Account Number:  [id number]  Date Initiated:  01/19/2014  Documentation initiated by:  Ronny FlurryWILE,Jojuan Champney  Subjective/Objective Assessment:     Action/Plan:   Anticipated DC Date:     Anticipated DC Plan:  HOME W HOME HEALTH SERVICES         Choice offered to / List presented to:     DME arranged  3-N-1  WALKER - ROLLING      DME agency  [employer]     HH arranged  HH-1 RN  HH-2 PT  HH-3 OT      [facility]   Status of service:   Medicare Important Message given?   (If response is "NO", the following Medicare IM given date fields will be blank) Date Medicare IM given:   Date Additional Medicare IM given:    Discharge Disposition:    Per UR Regulation:    If discussed at Long Length of Stay Meetings, dates discussed:    Comments:    01-19-14 Confirmed with Minerva AreolaEric at Dr Wadie Lessenowan's office no CPM needed ( listed under DC needs in today 's progress note ) . Brent with [employer] has orders for walker and 3 in 1.  Ronny FlurryHeather Lavinia Mcneely RN BSN

## 2014-01-19 NOTE — Evaluation (Signed)
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  S/P L THA  Clinical Impression  Pt admitted with/for LTHA.  Pt currently limited functionally due to the problems listed below.  (see problems list.)  Pt will benefit from PT to maximize function and safety to be able to get home safely with available assist of family.     Follow Up Recommendations Home health PT    Equipment Recommendations  Rolling walker with 5" wheels;3in1 (PT)    Recommendations for Other Services       Precautions / Restrictions Precautions Precautions: Posterior Hip Restrictions LLE Weight Bearing: Weight bearing as tolerated      Mobility  Bed Mobility Overal bed mobility: Needs Assistance Bed Mobility: Supine to Sit;Sit to Supine     Supine to sit: Supervision Sit to supine: Supervision   General bed mobility comments: supervision after technique demonstrated.  Transfers Overall transfer level: Needs assistance Equipment used: Rolling walker (2 wheeled) Transfers: Sit to/from Stand Sit to Stand: Min guard         General transfer comment: cues for technique, no assist needed.  Ambulation/Gait Ambulation/Gait assistance: Supervision Ambulation Distance (Feet): 200 Feet Assistive device: Rolling walker (2 wheeled) Gait Pattern/deviations: Step-to pattern   Gait velocity interpretation: Below normal speed for age/gender General Gait Details: mildly antalgic and slow, but safe  Stairs            Wheelchair Mobility    Modified Rankin (Stroke Patients Only)       Balance Overall balance assessment: No apparent balance deficits (not formally assessed)                                           Pertinent Vitals/Pain     Home Living Family/patient expects to be discharged to:: Private  residence Living Arrangements: Spouse/significant other Available Help at Discharge: Family;Available PRN/intermittently Type of Home: House Home Access: Stairs to enter Entrance Stairs-Rails: Right;Left Entrance Stairs-Number of Steps: 3 Home Layout: Two level;Able to live on main level with bedroom/bathroom Home Equipment: Gilmer MorCane - single point      Prior Function Level of Independence: Independent         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     Hand Dominance        Extremity/Trunk Assessment   Upper Extremity Assessment: Overall WFL for tasks assessed           Lower Extremity Assessment: Overall WFL for tasks assessed;LLE deficits/detail   LLE Deficits / Details: painful and limited fully active ROM presently     Communication   Communication: No difficulties  Cognition Arousal/Alertness: Awake/alert Behavior During Therapy: WFL for tasks assessed/performed Overall Cognitive Status: Within Functional Limits for tasks assessed                      General Comments      Exercises Total Joint Exercises Ankle Circles/Pumps: AROM Quad Sets: AROM;Both;10 reps Heel Slides: AAROM;Left;10 reps Hip ABduction/ADduction: AAROM;15 reps;Left;Supine      Assessment/Plan    PT Assessment Patient needs continued PT services  PT Diagnosis     PT Problem List Decreased strength;Decreased mobility;Decreased knowledge of use of DME;Decreased knowledge of precautions;Pain  PT Treatment Interventions DME instruction;Gait training;Functional mobility training;Therapeutic activities;Patient/family education;Therapeutic exercise   PT Goals (Current goals can be found in the Care Plan section) Acute Rehab PT Goals Patient Stated Goal: back to work  PT Goal Formulation: With patient Time For Goal Achievement: 01/19/14 Potential to Achieve Goals: Good    Frequency 7X/week   Barriers to discharge Other (comment) (wife  works and will be at work day after [employer])      Co-evaluation               End of Session   Activity Tolerance: Patient tolerated treatment well Patient left: in bed;with call bell/phone within reach Nurse Communication: Mobility status         Time: [id number] PT Time Calculation (min): 46 min   Charges:   PT Evaluation $Initial PT Evaluation Tier I: 1 Procedure PT Treatments $Gait Training: 8-22 mins $Therapeutic Exercise: 8-22 mins $Therapeutic Activity: 8-22 mins   PT G CodesEliseo Gum:          Tonie Elsey V Wilbur Labuda 01/19/2014, 11:13 AM 01/19/2014  [place] BingKen Leani Myron, PT [phone]  (pager)

## 2014-01-19 NOTE — Anesthesia Postprocedure Evaluation (Signed)
  Anesthesia Post-op Note  Patient: Festus BarrenShannon Chavero  Procedure(s) Performed: Procedure(s): TOTAL HIP ARTHROPLASTY (Left)  Patient Location: PACU  Anesthesia Type:General  Level of Consciousness: awake, alert , oriented and patient cooperative  Airway and Oxygen Therapy: Patient Spontanous Breathing  Post-op Pain: moderate  Post-op Assessment: Post-op Vital signs reviewed, Patient's Cardiovascular Status Stable, Respiratory Function Stable, Patent Airway and No signs of Nausea or vomiting  Post-op Vital Signs: stable  Last Vitals:  Filed Vitals:   01/19/14 0605  BP: 138/76  Pulse: 97  Temp: 37.3 C  Resp: 16    Complications: No apparent anesthesia complications

## 2014-01-20 LAB — CBC
HEMATOCRIT: 32 % — AB (ref 39.0–52.0)
Hemoglobin: 10.7 g/dL — ABNORMAL LOW (ref 13.0–17.0)
MCH: 30.1 pg (ref 26.0–34.0)
MCHC: 33.4 g/dL (ref 30.0–36.0)
MCV: 89.9 fL (ref 78.0–100.0)
Platelets: 219 10*3/uL (ref 150–400)
RBC: 3.56 MIL/uL — AB (ref 4.22–5.81)
RDW: 12.9 % (ref 11.5–15.5)
WBC: 10.3 10*3/uL (ref 4.0–10.5)

## 2014-01-20 NOTE — Progress Notes (Signed)
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  S/P L THA    PT Comments    All goals met.  Education completed.  Pt ready to D/C home with therapy.  Follow Up Recommendations  Home health PT     Equipment Recommendations  Rolling walker with 5" wheels;3in1 (PT)    Recommendations for Other Services       Precautions / Restrictions Precautions Precautions: Posterior Hip Precaution Comments: Educated pt on posterior hip precautions. Restrictions Weight Bearing Restrictions: Yes LLE Weight Bearing: Weight bearing as tolerated    Mobility  Bed Mobility Overal bed mobility: Modified Independent Bed Mobility: Supine to Sit;Sit to Supine     Supine to sit: Modified independent (Device/Increase time) Sit to supine: Modified independent (Device/Increase time)   General bed mobility comments: uses good technique  Transfers Overall transfer level: Modified independent   Transfers: Sit to/from Stand Sit to Stand: Modified independent (Device/Increase time)         General transfer comment: used good technique  Ambulation/Gait Ambulation/Gait assistance: Supervision Ambulation Distance (Feet): 300 Feet Assistive device: Rolling walker (2 wheeled) Gait Pattern/deviations: Step-through pattern     General Gait Details: steady with speed increasing   Stairs Stairs: Yes Stairs assistance: Supervision Stair Management: One rail Right;With walker;Step to pattern;Forwards Number of Stairs: 4 General stair comments: steady with safe technique  Wheelchair Mobility    Modified Rankin (Stroke Patients Only)       Balance Overall balance assessment: No apparent balance deficits (not formally assessed)                                  Cognition Arousal/Alertness: Awake/alert Behavior During  Therapy: WFL for tasks assessed/performed Overall Cognitive Status: Within Functional Limits for tasks assessed                      Exercises Total Joint Exercises Ankle Circles/Pumps: AROM Short Arc Quad: AROM;Left;10 reps;Supine Heel Slides: AROM;Left;10 reps;Supine Hip ABduction/ADduction: AAROM;Left;10 reps    General Comments        Pertinent Vitals/Pain 4/10 with meds    Home Living                      Prior Function            PT Goals (current goals can now be found in the care plan section) Acute Rehab PT Goals Patient Stated Goal: back to work  PT Goal Formulation: With patient Time For Goal Achievement: 01/19/14 Potential to Achieve Goals: Good Progress towards PT goals: Progressing toward goals    Frequency  7X/week    PT Plan Current plan remains appropriate    Co-evaluation             End of Session   Activity Tolerance: Patient tolerated treatment well Patient left: in chair;with call bell/phone within reach;with family/visitor present     Time: 1001-1045 PT Time Calculation (min): 44 min  Charges:  $Gait Training: 23-37 mins $Therapeutic Activity: 8-22 mins                    G CodesTessie Fass [name] 01/20/2014, 2:08 PM

## 2014-01-20 NOTE — Discharge Summary (Signed)
Patient given discharge paperwork. Reviewed follow-up appointment with MD in two weeks. Patient given prescriptions. Patient ready for discharge.

## 2014-01-20 NOTE — Progress Notes (Signed)
PATIENT ID: Phillip Floyd  MRN: [id number]  DOB/AGE:  50/20/1967 / 48 y.o.  2 Days Post-Op Procedure(s) (LRB): TOTAL HIP ARTHROPLASTY (Left)    PROGRESS NOTE Subjective: Patient is alert, oriented,no Nausea, no Vomiting, yes passing gas, no Bowel Movement. Taking PO well. Denies SOB, Chest or Calf Pain. Using Incentive Spirometer, PAS in place. Ambulate WBAT Patient reports pain as 4 on 0-10 scale  .    Objective: Vital signs in last 24 hours: Filed Vitals:   01/19/14 2256 01/20/14 0000 01/20/14 0400 01/20/14 0528  BP: 119/60   119/64  Pulse: 94   100  Temp: 99 F (37.2 C)   99.2 F (37.3 C)  TempSrc: Oral   Oral  Resp: 17 16 16 17   Height:      Weight:      SpO2: 100% 99% 98% 98%      Intake/Output from previous day: I/O last 3 completed shifts: In: 6826.2 [P.O.:2342; I.V.:4429.2; IV Piggyback:55] Out: 1000 [Urine:1000]   Intake/Output this shift:     LABORATORY DATA:  Recent Labs  01/19/14 0540 01/20/14 0615  WBC 10.5 10.3  HGB 11.3* 10.7*  HCT 33.8* 32.0*  PLT 224 219  NA 137  --   K 4.2  --   CL 101  --   CO2 22  --   BUN 9  --   CREATININE 0.78  --   GLUCOSE 150*  --   CALCIUM 9.0  --     Examination: Neurologically intact Neurovascular intact Sensation intact distally Intact pulses distally Dorsiflexion/Plantar flexion intact Incision: scant drainage} XR AP&Lat of hip shows well placed\fixed THA  Assessment:   2 Days Post-Op Procedure(s) (LRB): TOTAL HIP ARTHROPLASTY (Left) ADDITIONAL DIAGNOSIS:  Plan: PT/OT WBAT, THA  posterior precautions  DVT Prophylaxis: SCDx72 hrs, ASA 325 mg BID x 2 weeks  DISCHARGE PLAN: Home today  DISCHARGE NEEDS: HHPT, HHRN, Walker and 3-in-1 comode seat

## 2014-01-20 NOTE — Evaluation (Signed)
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  S/P L THA   Clinical Impression   Pt s/p L THA with posterior hip precautions.  OT provided education on techniques for completing LB ADLs (including use of AE) and toilet/shower transfers while maintaining precautions.  Pt has already had 3n1 and RW delivered to room.  Pt hopeful to d/c later today.  If pt remains through weekend, OT will return to reinforce AE education and practice shower and toilet transfers (pt declined during session this morning).  Pt with good adherence to hip precautions during bed mobility and practice of LB ADLs. Will continue to follow acutely in order to address below problem list.    Follow Up Recommendations  No OT follow up;Supervision/Assistance - 24 hour (24/7 supervision initially)    Equipment Recommendations  None recommended by OT    Recommendations for Other Services       Precautions / Restrictions Precautions Precautions: Posterior Hip Precaution Comments: Educated pt on posterior hip precautions. Restrictions Weight Bearing Restrictions: Yes LLE Weight Bearing: Weight bearing as tolerated      Mobility Bed Mobility Overal bed mobility: Needs Assistance Bed Mobility: Supine to Sit;Sit to Supine     Supine to sit: Supervision;HOB elevated (HOB 30 degrees) Sit to supine: Supervision;HOB elevated (HOB 30 degrees)   General bed mobility comments: Heavy reliance on bed rail initially.  Good use of Right foot to hook left LE and bring in/out of bed.  Independently adhering to hip precautions.  Transfers                      Balance                                            ADL Overall ADL's : Needs assistance/impaired         Upper Body Bathing: Set up;Sitting   Lower Body Bathing: Supervison/  safety;Sitting/lateral leans   Upper Body Dressing : Set up;Sitting   Lower Body Dressing: Cueing for compensatory techniques;Adhering to hip precautions;With adaptive equipment;Supervision/safety;Sitting/lateral leans Lower Body Dressing Details (indicate cue type and reason): Used reacher and sock aid while sitting EOB. VCs for technique.               General ADL Comments: Pt instructed on use of AE for LB ADLs in order to maintain hip precautions and to increased independence with ADLs.  Pt states he used to have a sock aid (wife with h/o THR) but recently threw it away.  Pt instructed on acquisition of long handled sponge and sock aid.  Pt states he feels a little more sore than yesterday. Declined OOB mobility during OT session due to arrival of breakfast tray, requesting to eat. OT instructed pt on toilet and shower transfer techniques for maintaining hip precautions.  Recommended use of shower seat rather than standing in shower in order to minimize fall risk and maintian hip precautions.      Vision                     Perception     Praxis      Pertinent Vitals/Pain C/o 5/10 L hip pain.     Hand Dominance  Extremity/Trunk Assessment Upper Extremity Assessment Upper Extremity Assessment: Overall WFL for tasks assessed           Communication Communication Communication: No difficulties   Cognition Arousal/Alertness: Awake/alert Behavior During Therapy: WFL for tasks assessed/performed Overall Cognitive Status: Within Functional Limits for tasks assessed                     General Comments       Exercises       Shoulder Instructions      Home Living Family/patient expects to be discharged to:: Private residence Living Arrangements: Spouse/significant other Available Help at Discharge: Family;Available PRN/intermittently Type of Home: House Home Access: Stairs to enter [employer] of Steps: 3 Entrance Stairs-Rails:  Right;Left Home Layout: Two level;Able to live on main level with bedroom/bathroom Alternate Level Stairs-Number of Steps: flight Alternate Level Stairs-Rails: Right;Left Bathroom Shower/Tub: Producer, television/film/video: Standard     Home Equipment: Cane - single point;Bedside commode;Walker - 2 wheels;Shower seat - built Dispensing optician Comments: 3n1 and RW delivered to hospital room.      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    OT Diagnosis: Generalized weakness;Acute pain   OT Problem List: Decreased strength;Decreased activity tolerance;Impaired balance (sitting and/or standing);Decreased knowledge of use of DME or AE;Decreased knowledge of precautions;Pain   OT Treatment/Interventions: Self-care/ADL training;DME and/or AE instruction;Therapeutic activities;Patient/family education;Balance training    OT Goals(Current goals can be found in the care plan section) Acute Rehab OT Goals Patient Stated Goal: back to work  OT Goal Formulation: With patient Time For Goal Achievement: 01/27/14 Potential to Achieve Goals: Good  OT Frequency: Min 2X/week   Barriers to D/C:            Co-evaluation              End of Session Equipment Utilized During Treatment:  (AE) Nurse Communication: Patient requests pain meds  Activity Tolerance: Patient tolerated treatment well Patient left: in bed;with call bell/phone within reach;with bed alarm set;with family/visitor present   Time: 0254-2706 OT Time Calculation (min): 30 min Charges:  OT General Charges $OT Visit: 1 Procedure OT Evaluation $Initial OT Evaluation Tier I: 1 Procedure OT Treatments $Self Care/Home Management : 23-37 mins G-Codes:    Grant Ruts February 10, 2014, 9:45 AM  2014-02-10 Grant Ruts OTR/L Pager [phone] Office [phone]

## 2014-01-20 NOTE — Discharge Summary (Signed)
Patient ID: Phillip Floyd MRN: [id number] DOB/AGE: December 16, 1965 48 y.o.  Admit date: 01/18/2014 Discharge date: 01/20/2014  Admission Diagnoses:  Active Problems:   Arthritis of left hip   Discharge Diagnoses:  Same  Past Medical History  Diagnosis Date  . GERD (gastroesophageal reflux disease)     takes Omeprazole daily  . History of bronchitis end of 2014  . Flu 07/2013  . Joint pain   . Arthritis   . Perthe's disease of hip     left    Surgeries: Procedure(s): TOTAL HIP ARTHROPLASTY on 01/18/2014   Consultants:    Discharged Condition: Improved  Hospital Course: Future Monrroy is an 48 y.o. male who was admitted 01/18/2014 for operative treatment of<principal problem not specified>. Patient has severe unremitting pain that affects sleep, daily activities, and work/hobbies. After pre-op clearance the patient was taken to the operating room on 01/18/2014 and underwent  Procedure(s): TOTAL HIP ARTHROPLASTY.    Patient was given perioperative antibiotics: Anti-infectives   Start     Dose/Rate Route Frequency Ordered Stop   01/18/14 0600  ceFAZolin (ANCEF) IVPB 2 g/50 mL premix     2 g 100 mL/hr over 30 Minutes Intravenous On call to O.R. 01/17/14 1412 01/18/14 1254       Patient was given sequential compression devices, early ambulation, and chemoprophylaxis to prevent DVT.  Patient benefited maximally from hospital stay and there were no complications.    Recent vital signs: Patient Vitals for the past 24 hrs:  BP Temp Temp src Pulse Resp SpO2  01/20/14 0528 119/64 mmHg 99.2 F (37.3 C) Oral 100 17 98 %  01/20/14 0400 - - - - 16 98 %  01/20/14 0000 - - - - 16 99 %  01/19/14 2256 119/60 mmHg 99 F (37.2 C) Oral 94 17 100 %  01/19/14 2000 - - - - 16 100 %  01/19/14 1749 128/77 mmHg 98.7 F (37.1 C) Oral 94 16 100 %  01/19/14 1600 - - - - 18 96 %  01/19/14 1346 133/82 mmHg 99.3 F (37.4 C) Oral 109 16 98 %  01/19/14 1200 - - - - 17 97 %  01/19/14 0920 134/77  mmHg 98.8 F (37.1 C) Oral 103 17 97 %  01/19/14 0800 - - - - 16 99 %     Recent laboratory studies:  Recent Labs  01/19/14 0540 01/20/14 0615  WBC 10.5 10.3  HGB 11.3* 10.7*  HCT 33.8* 32.0*  PLT 224 219  NA 137  --   K 4.2  --   CL 101  --   CO2 22  --   BUN 9  --   CREATININE 0.78  --   GLUCOSE 150*  --   CALCIUM 9.0  --      Discharge Medications:     Medication List         aspirin EC 325 MG tablet  Take 1 tablet (325 mg total) by mouth 2 (two) times daily.     HYDROcodone-acetaminophen 5-325 MG per tablet  Commonly known as:  NORCO/VICODIN  Take 1 tablet by mouth every 6 (six) hours as needed for moderate pain.     HYDROmorphone 4 MG tablet  Commonly known as:  DILAUDID  Take 0.5-1 tablets (2-4 mg total) by mouth every 4 (four) hours as needed for severe pain.     methocarbamol 500 MG tablet  Commonly known as:  ROBAXIN  Take 1 tablet (500 mg total) by mouth 4 (four) times  daily.     omeprazole 20 MG capsule  Commonly known as:  PRILOSEC  Take 20 mg by mouth daily as needed (acid reflux).        Diagnostic Studies: Dg Chest 2 View  01/11/2014   CLINICAL DATA:  Preoperative hip arthroplasty  EXAM: CHEST  2 VIEW  COMPARISON:  None.  FINDINGS: Lungs are clear. Heart size and pulmonary vascularity are normal. No adenopathy. No bone lesions.  IMPRESSION: No abnormality noted.   Electronically Signed   By: Bretta BangWilliam  Woodruff M.D.   On: 01/11/2014 09:58   Dg Pelvis Portable  01/18/2014   CLINICAL DATA:  Postop left hip replacement  EXAM: PORTABLE PELVIS 1-2 VIEWS  COMPARISON:  None.  FINDINGS: Left hip replacement in satisfactory position alignment. No fracture or dislocation.  IMPRESSION: Satisfactory left hip replacement.   Electronically Signed   By: Marlan Palauharles  Clark M.D.   On: 01/18/2014 16:20    Disposition: Final discharge disposition not confirmed      Discharge Instructions   Call MD / Call 911    Complete by:  As directed   If you experience chest  pain or shortness of breath, CALL 911 and be transported to the hospital emergency room.  If you develope a fever above 101 F, pus (white drainage) or increased drainage or redness at the wound, or calf pain, call your surgeon's office.     Change dressing    Complete by:  As directed   You may change your dressing on day 5, then change the dressing daily with sterile 4 x 4 inch gauze dressing and paper tape.  You may clean the incision with alcohol prior to redressing     Constipation Prevention    Complete by:  As directed   Drink plenty of fluids.  Prune juice may be helpful.  You may use a stool softener, such as Colace (over the counter) 100 mg twice a day.  Use MiraLax (over the counter) for constipation as needed.     Diet - low sodium heart healthy    Complete by:  As directed      Discharge instructions    Complete by:  As directed   Follow up in office with Dr. Turner Danielsowan in 2 weeks     Driving restrictions    Complete by:  As directed   No driving for 2 weeks     Follow the hip precautions as taught in Physical Therapy    Complete by:  As directed      Increase activity slowly as tolerated    Complete by:  As directed      Patient may shower    Complete by:  As directed   You may shower without a dressing once there is no drainage.  Do not wash over the wound.  If drainage remains, cover wound with plastic wrap and then shower.           Follow-up Information   Follow up with Nestor LewandowskyOWAN,FRANK J, MD In 2 weeks.   Specialty:  Orthopedic Surgery   Contact information:   [address] [place] [address] [phone]        Signed: Allena Katzric K Rucker Pridgeon 01/20/2014, 7:27 AM

## 2015-05-23 IMAGING — CR DG CHEST 2V
2 series · 2 of 2 positions shown · non-contrast
Comparison: None.

CLINICAL DATA: Preoperative hip arthroplasty

EXAM:
CHEST  2 VIEW

[w chest pa]
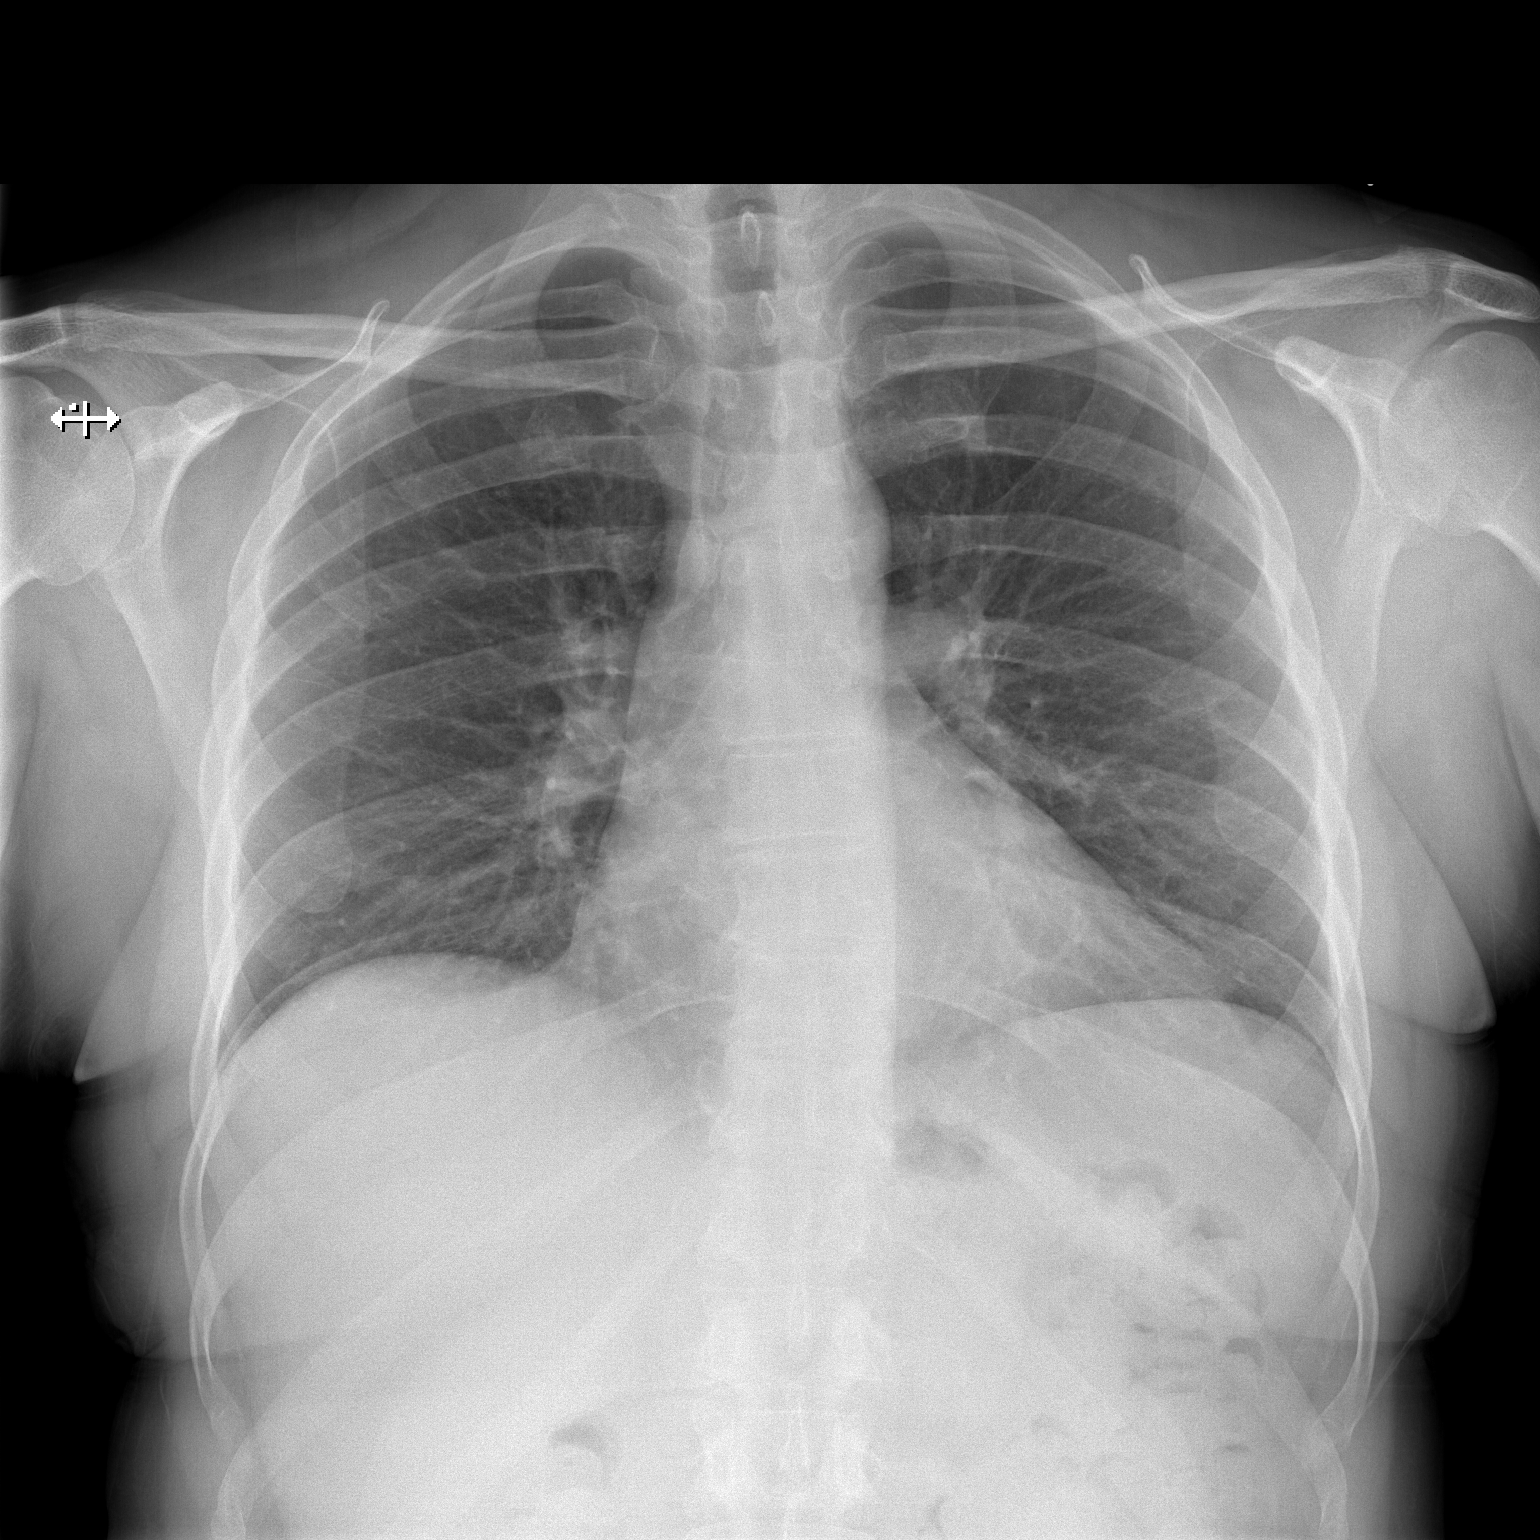

[w chest lat]
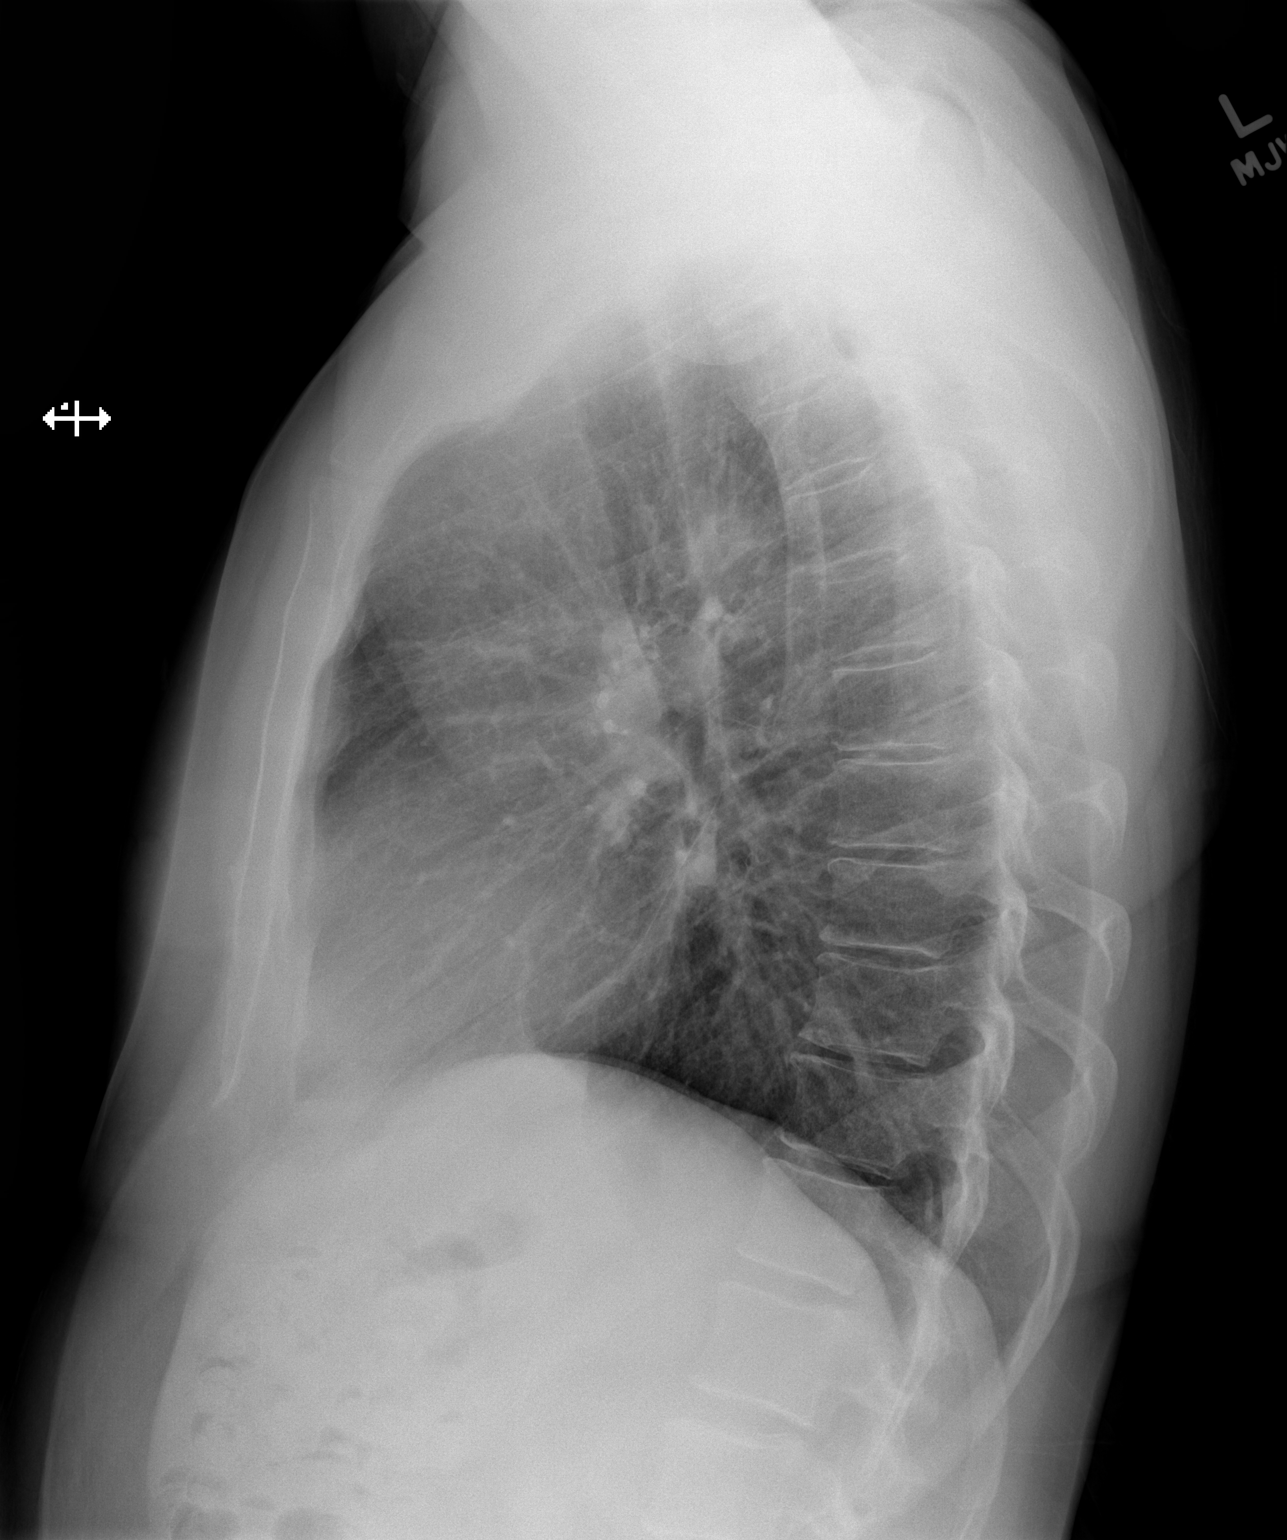

[2 of 2 positions shown; findings below may reference images not displayed]

FINDINGS: Lungs are clear. Heart size and pulmonary vascularity are normal. No
adenopathy. No bone lesions.
IMPRESSION: No abnormality noted.

## 2015-05-30 IMAGING — CR DG PORTABLE PELVIS
1 series · 1 of 1 positions shown · non-contrast
Comparison: None.

CLINICAL DATA: Postop left hip replacement

EXAM:
PORTABLE PELVIS 1-2 VIEWS

[AP]
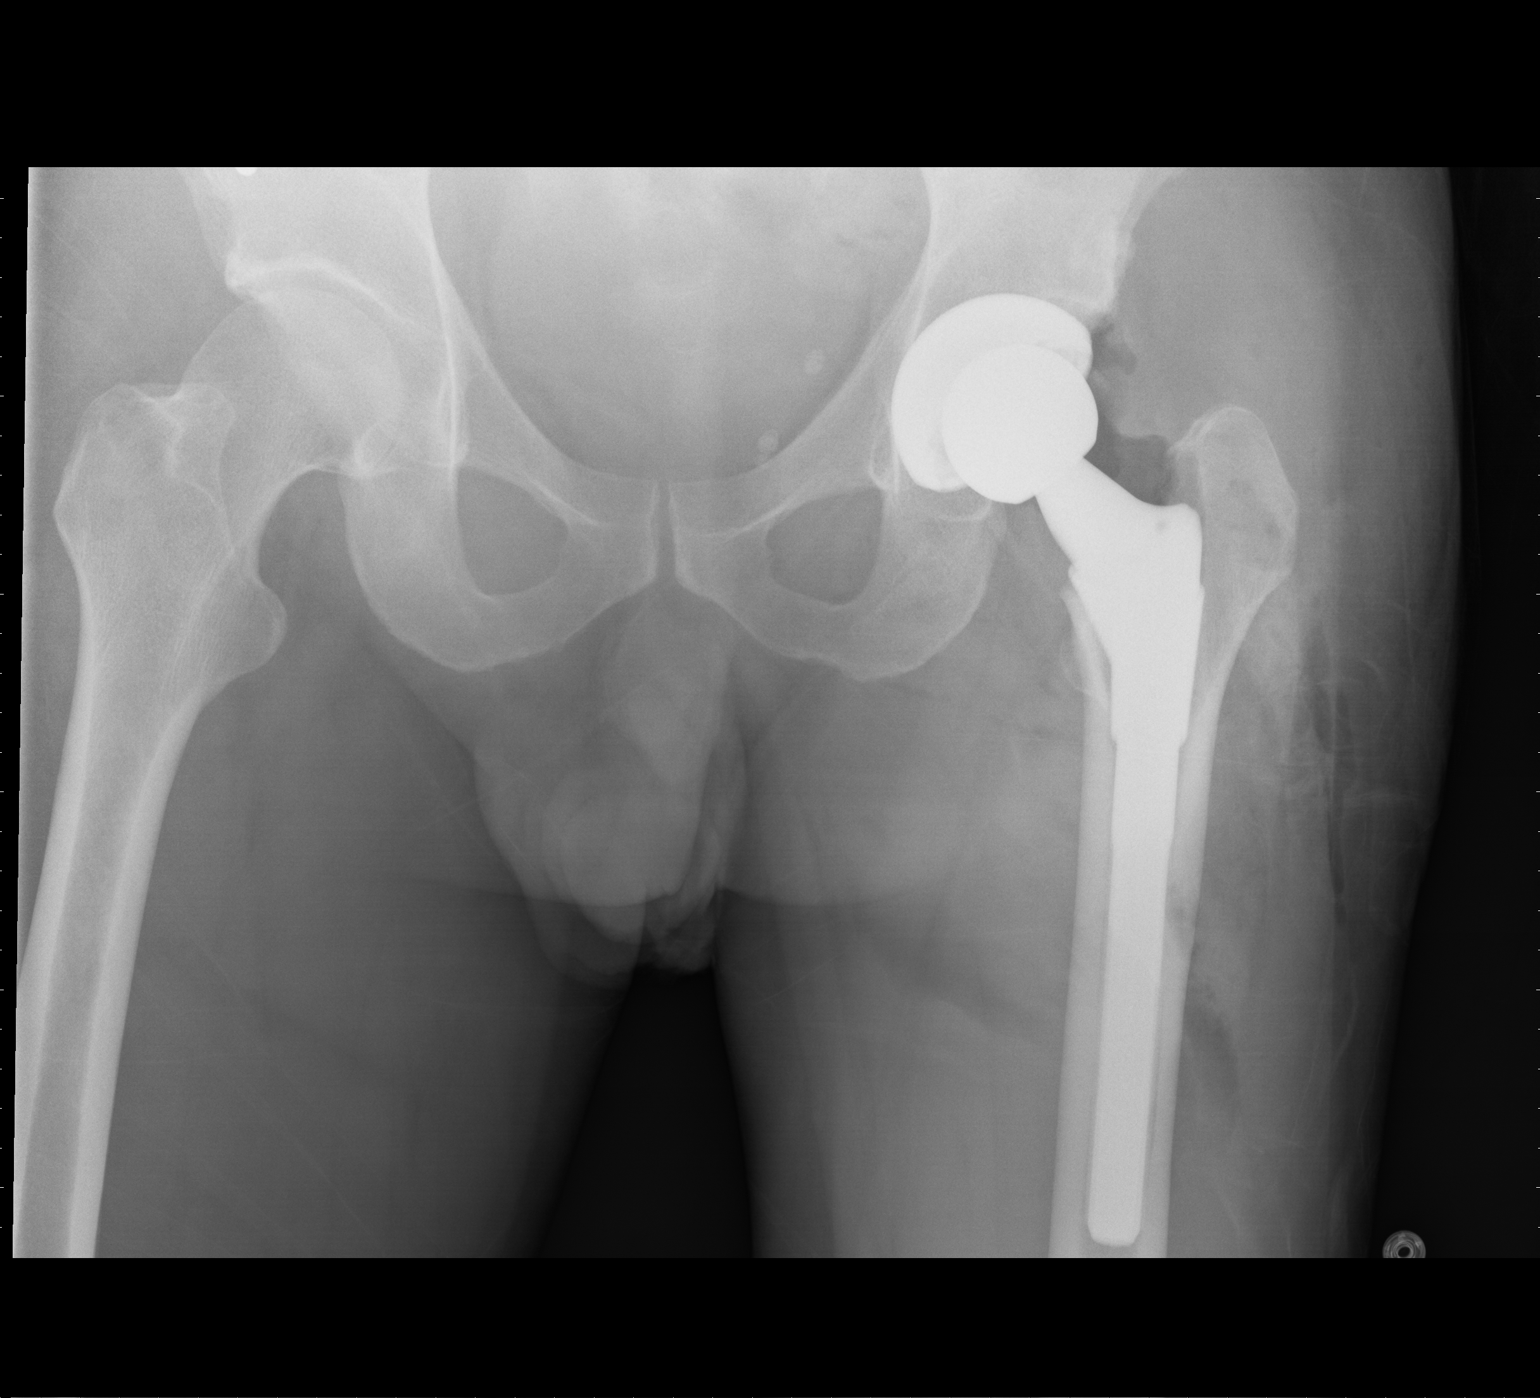

[1 of 1 positions shown; findings below may reference images not displayed]

FINDINGS: Left hip replacement in satisfactory position alignment. No fracture
or dislocation.
IMPRESSION: Satisfactory left hip replacement.

## 2024-04-28 ENCOUNTER — Encounter: Payer: Self-pay | Admitting: Internal Medicine

## 2024-04-28 ENCOUNTER — Other Ambulatory Visit: Payer: Self-pay

## 2024-04-28 ENCOUNTER — Ambulatory Visit: Admission: EM | Admit: 2024-04-28 | Discharge: 2024-04-28 | Disposition: A | Discharge status: Home / Self Care

## 2024-04-28 DIAGNOSIS — M545 Low back pain, unspecified: Secondary | ICD-10-CM | POA: Diagnosis not present

## 2024-04-28 LAB — POCT URINE DIPSTICK
Bilirubin, UA: NEGATIVE
Blood, UA: NEGATIVE
Glucose, UA: NEGATIVE mg/dL
Ketones, POC UA: NEGATIVE mg/dL
Leukocytes, UA: NEGATIVE
Nitrite, UA: NEGATIVE
POC PROTEIN,UA: NEGATIVE
Spec Grav, UA: 1.02 (ref 1.010–1.025)
Urobilinogen, UA: 0.2 U/dL
pH, UA: 5.5 (ref 5.0–8.0)

## 2024-04-28 MED ORDER — HYDROCODONE-ACETAMINOPHEN 5-325 MG PO TABS: 1.0000 | ORAL_TABLET | Freq: Three times a day (TID) | ORAL | 0 refills | Status: AC | PRN

## 2024-04-28 MED ORDER — ONDANSETRON 4 MG PO TBDP: 4.0000 mg | ORAL_TABLET | Freq: Three times a day (TID) | ORAL | 0 refills | Status: AC | PRN

## 2024-04-28 MED ORDER — BACLOFEN 5 MG PO TABS: 5.0000 mg | ORAL_TABLET | Freq: Three times a day (TID) | ORAL | 0 refills | Status: AC | PRN

## 2024-04-28 NOTE — Discharge Instructions (Signed)
 You were given a prescription for a muscle relaxer (Baclofen ) take at bedtime when you are not driving or working because it may cause dizziness/drowsiness.  You were also given a short course of pain medicine. You should take this when the pain is at its worse. You should not combine with the muscle relaxer. Do not mix with other products containing acetaminophen , do not combine with alcohol or other illicit drugs, do not drive or operate machinery, and refrain from any activity that will require complete attention while taking this medication. I have sent nausea medicine to take if you have any nausea with it. By [place] law, I can only prescribe a short course of pain medicine. If further is needed, you will need to see your PCP or orthopedics.   Recommend rest, ice, and compression. If is very important, that you follow up with orthopedics as directed.   Your evaluation was not suggestive of any emergent condition requiring medical intervention at this time. However, our office is limited in the tests and imaging we can perform at our facility.  Therefore, it is very important for you to pay attention to any new symptoms or worsening of your current condition.   Please go directly to the Emergency Department immediately should you begin to feel worse in any way or have any of the following symptoms: bowel/urinary incontinence, numbness between your legs, fever new onset numbness/tingling.

## 2024-04-28 NOTE — ED Provider Notes (Addendum)
 [facility]  Note:  This document was prepared using Dragon voice recognition software and may include unintentional dictation errors.  MRN: [id number] DOB: September 06, 1965 DATE: 04/28/24   Subjective:  Chief Complaint:  Chief Complaint  Patient presents with   Back Pain     HPI: Phillip Floyd is a 58 y.o. male presenting for right sided low back pain for 2 weeks. Patient reports no inciting injury. Patient states he was seen at another urgent care center on 04/18/2024. He reports that pain had started that day. He reports having to use a cane at that time to get in the office. He reports being unable to stand straight and had difficulty getting out of the bed. Imaging at that time showed no fracture, but moderate multilevel degenerative disc disease and multilevel facet arthropathy. He was prescribed a Prednisone taper at that time. He reports taking the taper as directed with no relief. He states he has only gotten slight improvement with Excedrin migraine. Pain is still on the right side with no radiation. Pain worse with certain movements such as going from sitting to standing. He reports having to sit surrounded by pillows. Denies fever, nausea/vomiting, abdominal pain, hematuria, dysuria. Endorses low back pain. Presents NAD.  Prior to Admission medications   Medication Sig Start Date End Date Taking? Authorizing Provider  Baclofen  5 MG TABS Take 1 tablet (5 mg total) by mouth 3 (three) times daily as needed. 04/28/24  Yes Jahmeer Porche P, PA-C  estradiol (ESTRACE) 1 MG tablet Take 1 mg by mouth daily.   Yes [provider]  HYDROcodone -acetaminophen  (NORCO/VICODIN) 5-325 MG tablet Take 1 tablet by mouth every 8 (eight) hours as needed for severe pain (pain score 7-10). 04/28/24  Yes Maze Corniel P, PA-C  ondansetron  (ZOFRAN -ODT) 4 MG disintegrating tablet Take 1 tablet (4 mg total) by mouth every 8 (eight) hours as needed for nausea or vomiting. 04/28/24  Yes Londen Bok,  Kenyonna Micek P, PA-C  pravastatin (PRAVACHOL) 20 MG tablet Take 20 mg by mouth daily.   Yes [provider]  progesterone (PROMETRIUM) 200 MG capsule Take 200 mg by mouth daily.   Yes [provider]  spironolactone (ALDACTONE) 100 MG tablet Take 100 mg by mouth daily.   Yes [provider]  aspirin  EC 325 MG tablet Take 1 tablet (325 mg total) by mouth 2 (two) times daily. 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 No pertinent family history.  Social History   Tobacco Use   Smoking status: Former    Current packs/day: 0.50    Average packs/day: 0.5 packs/day for 5.0 years (2.5 ttl pk-yrs)    Types: Cigarettes   Smokeless tobacco: Never   Tobacco comments:    quit smoking  in the 1990's  Substance Use Topics   Alcohol use: No   Drug use: No    Review of Systems  Constitutional:  Negative for fever.  Gastrointestinal:  Negative for abdominal pain, nausea and vomiting.  Genitourinary:  Negative for dysuria, flank pain and hematuria.  Musculoskeletal:  Positive for back pain and myalgias.  Skin:  Negative for rash.  Neurological:  Negative for numbness.     Objective:   Vitals: BP (!) 151/86 (BP Location: Right Arm)   Pulse 80   Temp 99 F (37.2 C) (Oral)   Resp 16   SpO2 96%   Physical Exam Constitutional:      General: He is not in  acute distress.    Appearance: Normal appearance. He is well-developed and normal weight. He is not ill-appearing or toxic-appearing.  HENT:     Head: Normocephalic and atraumatic.  Cardiovascular:     Rate and Rhythm: Normal rate and regular rhythm.     Heart sounds: Normal heart sounds.  Pulmonary:     Effort: Pulmonary effort is normal.     Breath sounds: Normal breath sounds.     Comments: Clear to auscultation bilaterally  Abdominal:     General: Bowel sounds are normal.     Palpations: Abdomen is soft.     Tenderness: There is no abdominal tenderness. There is no right CVA tenderness or left CVA tenderness.  Musculoskeletal:     Lumbar back: Tenderness present. Decreased range of motion.     Comments: TTP along right lower back. Decreased ROM due to pain with walking/weightbearing and flexion/extension. Patient appears to be limping on exam. No warmth, erythema, or discharge.   Skin:    General: Skin is warm and dry.  Neurological:     General: No focal deficit present.     Mental Status: He is alert.  Psychiatric:        Mood and Affect: Mood and affect normal.     Results:  Labs: Results for orders placed or performed during the hospital encounter of 04/28/24 (from the past 24 hours)  POCT URINE DIPSTICK     Status: None   Collection Time: 04/28/24  2:31 PM  Result Value Ref Range   Color, UA yellow yellow   Clarity, UA clear clear   Glucose, UA negative negative mg/dL   Bilirubin, UA negative negative   Ketones, POC UA negative negative mg/dL   Spec Grav, UA 8.979 8.989 - 1.025   Blood, UA negative negative   pH, UA 5.5 5.0 - 8.0   POC PROTEIN,UA negative negative, trace   Urobilinogen, UA 0.2 0.2 or 1.0 E.U./dL   Nitrite, UA Negative Negative   Leukocytes, UA Negative Negative    Radiology: No results found.   UC Course/Treatments:  Procedures: Procedures   Medications Ordered in UC: Medications - No data to display   Assessment and Plan :      ICD-10-CM   1. Acute right-sided low back pain without sciatica  M54.50 POCT URINE DIPSTICK    POCT URINE DIPSTICK     Acute right-sided low back pain without sciatica Afebrile, nontoxic-appearing, NAD. VSS. DDX includes but not limited to: fracture, contusion, dislocation, strain, cystitis, pyelonephritis, nephrolithiasis, DDD, arthritis UA unremarkable. Imaging from 10 days ago positive for DDD changes. Reports no improvement with Prednisone taper. Baclofen  5mg  TID PRN was prescribed for muscle spasms and pain. Norco 5-325mg  every 8 hours PRN for severe pain. Of note, patient reports nausea/vomiting with past pain  medicine. EMR shows he has had Norco in the past and nausea/vomiting with Oxycodone . Will prescribed Zofran  4mg  every 8 hours PRN for any nausea. Will discharge patient with a short course of opiates. Discussed risks. Advised patient not to mix with other products containing acetaminophen , not to combine with alcohol or other illicit drugs, not to drive or operate machinery, and to refrain from any activity that will require complete attention while taking this medication. Patient instructed to follow up with orthopedics for more long term management. Patient declined steroid injection today in office. Strict ED precautions were given and patient verbalized understanding.  ED Discharge Orders          Ordered    Baclofen  5 MG TABS  3 times daily PRN        04/28/24 1436    ondansetron  (ZOFRAN -ODT) 4 MG disintegrating tablet  Every 8 hours PRN        04/28/24 1437    HYDROcodone -acetaminophen  (NORCO/VICODIN) 5-325 MG tablet  Every 8 hours PRN        04/28/24 1438             I have reviewed the PDMP during this encounter.     Kenyon Eshleman P, PA-C 04/28/24 1451    Arvind Mexicano P, PA-C 04/28/24 1452    Javontay Vandam P, PA-C 04/28/24 1454

## 2024-04-28 NOTE — ED Triage Notes (Signed)
 C/O right lower back pain for two weeks. Patient states he was seen at a walk in two weeks ago, had x rays and was given steroids. Patient states no improvement.
